# Patient Record
Sex: Male | Born: 1990 | Race: Black or African American | Hispanic: No | Marital: Single | Smoking: Former smoker
Health system: Southern US, Community
[De-identification: ages and names within clinical notes are randomized; demographics above are authoritative.]

---
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
## 2010-12-09 ENCOUNTER — Emergency Department: Payer: Self-pay | Admitting: Emergency Medicine

## 2010-12-22 ENCOUNTER — Emergency Department: Payer: Self-pay | Admitting: Unknown Physician Specialty

## 2011-04-27 ENCOUNTER — Emergency Department: Payer: Self-pay | Admitting: Unknown Physician Specialty

## 2011-06-15 ENCOUNTER — Emergency Department: Payer: Self-pay | Admitting: Emergency Medicine

## 2012-08-02 ENCOUNTER — Emergency Department: Payer: Self-pay | Admitting: Emergency Medicine

## 2012-11-12 ENCOUNTER — Emergency Department: Payer: Self-pay | Admitting: Emergency Medicine

## 2014-03-20 ENCOUNTER — Emergency Department: Payer: Self-pay | Admitting: Emergency Medicine

## 2014-03-23 LAB — BETA STREP CULTURE(ARMC)

## 2015-07-06 ENCOUNTER — Emergency Department
Admission: EM | Admit: 2015-07-06 | Discharge: 2015-07-06 | Disposition: A | Discharge status: Home / Self Care | Payer: Self-pay | Attending: Emergency Medicine | Admitting: Emergency Medicine

## 2015-07-06 ENCOUNTER — Encounter: Payer: Self-pay | Admitting: *Deleted

## 2015-07-06 DIAGNOSIS — Y9389 Activity, other specified: Secondary | ICD-10-CM | POA: Insufficient documentation

## 2015-07-06 DIAGNOSIS — S6991XA Unspecified injury of right wrist, hand and finger(s), initial encounter: Secondary | ICD-10-CM | POA: Insufficient documentation

## 2015-07-06 DIAGNOSIS — X31XXXA Exposure to excessive natural cold, initial encounter: Secondary | ICD-10-CM | POA: Insufficient documentation

## 2015-07-06 DIAGNOSIS — Y998 Other external cause status: Secondary | ICD-10-CM | POA: Insufficient documentation

## 2015-07-06 DIAGNOSIS — T699XXA Effect of reduced temperature, unspecified, initial encounter: Secondary | ICD-10-CM

## 2015-07-06 DIAGNOSIS — R202 Paresthesia of skin: Secondary | ICD-10-CM

## 2015-07-06 DIAGNOSIS — Y9289 Other specified places as the place of occurrence of the external cause: Secondary | ICD-10-CM | POA: Insufficient documentation

## 2015-07-06 DIAGNOSIS — S6992XA Unspecified injury of left wrist, hand and finger(s), initial encounter: Secondary | ICD-10-CM | POA: Insufficient documentation

## 2015-07-06 MED ORDER — METHYLPREDNISOLONE 4 MG PO TBPK: ORAL_TABLET | ORAL | Status: DC
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
## 2015-07-06 NOTE — Discharge Instructions (Signed)
Paresthesia  Paresthesia is a burning or prickling feeling. This feeling can happen in any part of the body. It often happens in the hands, arms, legs, or feet. Usually, it is not painful. In most cases, the feeling goes away in a short time and is not a sign of a serious problem.  HOME CARE  · Avoid drinking alcohol.  · Try massage or needle therapy (acupuncture) to help with your problems.  · Keep all follow-up visits as told by your doctor. This is important.  GET HELP IF:  · You keep on having episodes of paresthesia.  · Your burning or prickling feeling gets worse when you walk.  · You have pain or cramps.  · You feel dizzy.  · You have a rash.  GET HELP RIGHT AWAY IF:  · You feel weak.  · You have trouble walking or moving.  · You have problems speaking, understanding, or seeing.  · You feel confused.  · You cannot control when you pee (urinate) or poop (bowel movement).  · You lose feeling (numbness) after an injury.  · You pass out (faint).     This information is not intended to replace advice given to you by your health care provider. Make sure you discuss any questions you have with your health care provider.     Document Released: 05/26/2008 Document Revised: 10/28/2014 Document Reviewed: 06/09/2014  Elsevier Interactive Patient Education ©2016 Elsevier Inc.

## 2015-10-06 ENCOUNTER — Emergency Department
Admission: EM | Admit: 2015-10-06 | Discharge: 2015-10-06 | Disposition: A | Discharge status: Home / Self Care | Payer: Self-pay | Attending: Emergency Medicine | Admitting: Emergency Medicine

## 2015-10-06 ENCOUNTER — Encounter: Payer: Self-pay | Admitting: Emergency Medicine

## 2015-10-06 ENCOUNTER — Emergency Department: Payer: Self-pay

## 2015-10-06 DIAGNOSIS — R319 Hematuria, unspecified: Secondary | ICD-10-CM

## 2015-10-06 DIAGNOSIS — R309 Painful micturition, unspecified: Secondary | ICD-10-CM | POA: Insufficient documentation

## 2015-10-06 DIAGNOSIS — N50811 Right testicular pain: Secondary | ICD-10-CM

## 2015-10-06 DIAGNOSIS — R300 Dysuria: Secondary | ICD-10-CM

## 2015-10-06 LAB — URINALYSIS COMPLETE WITH MICROSCOPIC (ARMC ONLY)
BILIRUBIN URINE: NEGATIVE
Bacteria, UA: NONE SEEN
Glucose, UA: NEGATIVE mg/dL
Hgb urine dipstick: NEGATIVE
Leukocytes, UA: NEGATIVE
Nitrite: NEGATIVE
PROTEIN: 30 mg/dL — AB
SPECIFIC GRAVITY, URINE: 1.029 (ref 1.005–1.030)
Squamous Epithelial / LPF: NONE SEEN
pH: 5 (ref 5.0–8.0)

## 2015-10-06 LAB — CHLAMYDIA/NGC RT PCR (ARMC ONLY)
Chlamydia Tr: NOT DETECTED
N gonorrhoeae: NOT DETECTED

## 2015-10-06 NOTE — ED Provider Notes (Signed)
[facility] Emergency Department Provider Note  ____________________________________________  Time seen: Approximately 3:30 PM  I have reviewed the triage vital signs and the nursing notes.   HISTORY  Chief Complaint Dysuria and Hematuria    HPI Eddie York is a 25 y.o. male , otherwise healthy, presenting for hematuria, and right scrotal pain. The patient reports that this morning he noticed some mild hematuria, but did not have any burning sensation or pain with urination. No frequency. No purulent discharge from the penis or known lesions. Patient also reports that since 2016 he has intermittently had some pain in the right scrotum, mostly when he is sitting down at work, described as short small pains that resolved when he stands up.  SH: sexually active with a single male partner  History reviewed. No pertinent past medical history.  There are no active problems to display for this patient.   History reviewed. No pertinent past surgical history.  Current Outpatient Rx  Name  Route  Sig  Dispense  Refill  . methylPREDNISolone (MEDROL DOSEPAK) 4 MG TBPK tablet      Take Tapered dose as directed   21 tablet   0     Allergies Review of patient's allergies indicates no known allergies.  No family history on file.  Social History Social History  Substance Use Topics  . Smoking status: Never Smoker   . Smokeless tobacco: None  . Alcohol Use: Yes     Comment: couple times a week    Review of Systems Constitutional: No fever/chills. no lightheadedness or syncope.  Eyes: No visual changes. ENT: No sore throat. No congestion or rhinorrhea. Respiratory: Denies shortness of breath.   Gastrointestinal: No abdominal pain. no flank pain.   No nausea, no vomiting.  No diarrhea.  No constipation. Genitourinary: Negative for dysuria. positive hematuria. Positive intermittent right scrotal pain.  MusculoskeletPositive chronic and unchanged  patient moves around the room comfortably. ain. Skin: Negative for rash. Neurological: Negative for headaches. No focal numbness, tingling or weakness.   10-point ROS otherwise negative.  ____________________________________________   PHYSICAL EXAM:  VITAL SIGNS: ED Triage Vitals  Enc Vitals Group     BP 10/06/15 1502 155/93 mmHg     Pulse Rate 10/06/15 1502 92     Resp 10/06/15 1502 20     Temp 10/06/15 1502 98.7 F (37.1 C)     Temp Source 10/06/15 1502 Oral     SpO2 10/06/15 1502 96 %     Weight 10/06/15 1502 250 lb (113.399 kg)     Height 10/06/15 1502 6' (1.829 m)     Head Cir --      Peak Flow --      Pain Score 10/06/15 1513 0     Pain Loc --      Pain Edu? --      Excl. in GC? --     Constitutional: Alert and oriented. Well appearing and in no acute distress. Answers questions appropriately. moves about the room comfortably.  Eyes: Conjunctivae are normal.  EOMI. No scleral icterus. Head: Atraumatic. Nose: No congestion/rhinnorhea. Mouth/Throat: Mucous membranes are moist.  Neck: No stridor.  Supple.   Cardiovascular: Normal rate, regular rhythm. No murmurs, rubs or gallops.  Respiratory: Normal respiratory effort.  No accessory muscle use or retractions. Lungs CTAB.  No wheezes, rales or ronchi. Gastrointestinal: Soft, nontender and nondistended.  No guarding or rebound.  No peritoneal signs.  No CVA ttp. Genitourinary:  normal-appearing penis without lesions  or discharge. Normal lie of the testicles, without any palpable masses or pain on palpation. No inguinal hernias bilaterally. On the right side of the pubic symphysis, the patient has 2 discrete 3 mm erythematous lesions that are not vesicular and have no purulence that are most consistent with folliculitis. Musculoskeletal: No LE edema. No ttp in the calves or palpable cords.  Negative Homan's sign. Neurologic:  A&Ox3.  Speech is clear.  Face and smile are symmetric.  EOMI.  Moves all extremities well. Skin:   Skin is warm, dry and intact. Psychiatric: Mood and affect are normal. Speech and behavior are normal.  Normal judgement.  ____________________________________________   LABS (all labs ordered are listed, but only abnormal results are displayed)  Labs Reviewed  URINALYSIS COMPLETEWITH MICROSCOPIC ([facility] ONLY) - Abnormal; Notable for the following:    Color, Urine YELLOW (*)    APPearance CLEAR (*)    Ketones, ur TRACE (*)    Protein, ur 30 (*)    All other components within normal limits  CHLAMYDIA/NGC RT PCR ([facility] ONLY)   ____________________________________________  EKG  Not indicated ____________________________________________  RADIOLOGY  US Scrotum  10/06/2015  CLINICAL DATA:  Right testicular pain EXAM: SCROTAL ULTRASOUND DOPPLER ULTRASOUND OF THE TESTICLES TECHNIQUE: Complete ultrasound examination of the testicles, epididymis, and other scrotal structures was performed. Color and spectral Doppler ultrasound were also utilized to evaluate blood flow to the testicles. COMPARISON:  None. FINDINGS: Right testicle Measurements: 4.3 x 2.1 x 2.9 cm. No mass or microlithiasis visualized. Left testicle Measurements: 5.1 x 2 x 2.9 cm. No mass or microlithiasis visualized. Right epididymis:  Normal in size and appearance. Left epididymis: Small left epididymal cyst. Otherwise normal left epididymis. Hydrocele:  Small bilateral hydroceles. Varicocele:  None visualized. Pulsed Doppler interrogation of both testes demonstrates normal low resistance arterial and venous waveforms bilaterally. IMPRESSION: 1. No testicular torsion. 2. No testicular mass. Electronically Signed   By: Elige Ko   On: 10/06/2015 16:18   [place] Art/ven Flow Abd Pelv Doppler  10/06/2015  CLINICAL DATA:  Right testicular pain EXAM: SCROTAL ULTRASOUND DOPPLER ULTRASOUND OF THE TESTICLES TECHNIQUE: Complete ultrasound examination of the testicles, epididymis, and other scrotal structures was performed. Color and  spectral Doppler ultrasound were also utilized to evaluate blood flow to the testicles. COMPARISON:  None. FINDINGS: Right testicle Measurements: 4.3 x 2.1 x 2.9 cm. No mass or microlithiasis visualized. Left testicle Measurements: 5.1 x 2 x 2.9 cm. No mass or microlithiasis visualized. Right epididymis:  Normal in size and appearance. Left epididymis: Small left epididymal cyst. Otherwise normal left epididymis. Hydrocele:  Small bilateral hydroceles. Varicocele:  None visualized. Pulsed Doppler interrogation of both testes demonstrates normal low resistance arterial and venous waveforms bilaterally. IMPRESSION: 1. No testicular torsion. 2. No testicular mass. Electronically Signed   By: Elige Ko   On: 10/06/2015 16:18    ____________________________________________   PROCEDURES  Procedure(s) performed: None  Critical Care performed: No ____________________________________________   INITIAL IMPRESSION / ASSESSMENT AND PLAN / ED COURSE  Pertinent labs & imaging results that were available during my care of the patient were reviewed by me and considered in my medical decision making (see chart for details).  25 y.o. M presenting w/ hematuria x 1d and intermittent R scrotal discomfort since 2016 w/o any systemic sx's.  I will evaluate the pt for UTI, chlamydia and gonorrhea.  The most likely etiology of the pt's rash is folliculitis, rather than a herpetic outbreak as it does not have a  typical herpes appearance.  For precaution, however, I have told him to refrain from all sexual activity until fully healed.  The pt may have renal colic, but is not having any signs or symptoms of large stones or obstruction.  5:55 PM Pt does not have a UTI.  Pt u/s scrotum w/o torsion or mass.  Awaiting chlamydia/gonorrhea for discharge.  ____________________________________________  FINAL CLINICAL IMPRESSION(S) / ED DIAGNOSES  Final diagnoses:  Right testicular pain  Burning with urination   Hematuria      NEW MEDICATIONS STARTED DURING THIS VISIT:  Discharge Medication List as of 10/06/2015  5:13 PM       Rockne MenghiniAnne-Caroline Liisa Picone, MD 10/06/15 1755

## 2015-10-06 NOTE — ED Notes (Signed)
Patient presents to the ED with pain when using the bathroom and blood in his urine.  Patient reports scrotal pain as well.  Patient states symptoms began about 2 days ago.  Patient reports chronic lower back pain.  Patient is in no obvious distress in triage.  Sitting comfortably and laughing with significant other.

## 2015-10-06 NOTE — Discharge Instructions (Signed)
Please make an appointment with your primary care physician to be re-evaluated for your symptoms.  Return to the emergency department for severe pain, fever, inability to keep down liquids, or any other symptoms concerning to you.  Hematuria, Adult Hematuria is blood in your urine. It can be caused by a bladder infection, kidney infection, prostate infection, kidney stone, or cancer of your urinary tract. Infections can usually be treated with medicine, and a kidney stone usually will pass through your urine. If neither of these is the cause of your hematuria, further workup to find out the reason may be needed. It is very important that you tell your health care provider about any blood you see in your urine, even if the blood stops without treatment or happens without causing pain. Blood in your urine that happens and then stops and then happens again can be a symptom of a very serious condition. Also, pain is not a symptom in the initial stages of many urinary cancers. HOME CARE INSTRUCTIONS   Drink lots of fluid, 3-4 quarts a day. If you have been diagnosed with an infection, cranberry juice is especially recommended, in addition to large amounts of water.  Avoid caffeine, tea, and carbonated beverages because they tend to irritate the bladder.  Avoid alcohol because it may irritate the prostate.  Take all medicines as directed by your health care provider.  If you were prescribed an antibiotic medicine, finish it all even if you start to feel better.  If you have been diagnosed with a kidney stone, follow your health care provider's instructions regarding straining your urine to catch the stone.  Empty your bladder often. Avoid holding urine for long periods of time.  After a bowel movement, women should cleanse front to back. Use each tissue only once.  Empty your bladder before and after sexual intercourse if you are a male. SEEK MEDICAL CARE IF:  You develop back pain.  You  have a fever.  You have a feeling of sickness in your stomach (nausea) or vomiting.  Your symptoms are not better in 3 days. Return sooner if you are getting worse. SEEK IMMEDIATE MEDICAL CARE IF:   You develop severe vomiting and are unable to keep the medicine down.  You develop severe back or abdominal pain despite taking your medicines.  You begin passing a large amount of blood or clots in your urine.  You feel extremely weak or faint, or you pass out. MAKE SURE YOU:   Understand these instructions.  Will watch your condition.  Will get help right away if you are not doing well or get worse.   This information is not intended to replace advice given to you by your health care provider. Make sure you discuss any questions you have with your health care provider.   Document Released: 06/13/2005 Document Revised: 07/04/2014 Document Reviewed: 02/11/2013 Elsevier Interactive Patient Education [employer]2016 Elsevier Inc.

## 2016-04-14 ENCOUNTER — Emergency Department: Payer: Self-pay

## 2016-04-14 ENCOUNTER — Emergency Department
Admission: EM | Admit: 2016-04-14 | Discharge: 2016-04-14 | Disposition: A | Discharge status: Home / Self Care | Payer: Self-pay | Attending: Emergency Medicine | Admitting: Emergency Medicine

## 2016-04-14 ENCOUNTER — Encounter: Payer: Self-pay | Admitting: Emergency Medicine

## 2016-04-14 DIAGNOSIS — Z79899 Other long term (current) drug therapy: Secondary | ICD-10-CM | POA: Insufficient documentation

## 2016-04-14 DIAGNOSIS — M5440 Lumbago with sciatica, unspecified side: Secondary | ICD-10-CM | POA: Insufficient documentation

## 2016-04-14 MED ORDER — CYCLOBENZAPRINE HCL 10 MG PO TABS: 10.0000 mg | ORAL_TABLET | Freq: Three times a day (TID) | ORAL | 0 refills | Status: DC | PRN

## 2016-04-14 MED ORDER — METHYLPREDNISOLONE 4 MG PO TBPK: ORAL_TABLET | ORAL | 0 refills | Status: DC

## 2016-04-14 MED ORDER — TRAMADOL HCL 50 MG PO TABS: 50.0000 mg | ORAL_TABLET | Freq: Four times a day (QID) | ORAL | 0 refills | Status: DC | PRN

## 2016-04-14 NOTE — ED Notes (Signed)
Patient transported to X-ray 

## 2016-04-14 NOTE — ED Provider Notes (Signed)
[facility] Emergency Department Provider Note   ____________________________________________   None    (approximate)  I have reviewed the triage vital signs and the nursing notes.   HISTORY  Chief Complaint Back Pain    HPI Eddie York is a 25 y.o. male is complaining of chronic back pain for almost 10 years. Patient denies any specific injury. Patient stated pain increases when he works longer than 6 hours today. Patient's stay in the last 2 days his pain has increased with radicular component to the bilateral legs. Patient denies any bladder or bowel dysfunction. Patient state the discomfort is relieved by rest. Patient is currently rating his pain as a 10 over 10. No palliative measures taken for this complaint.   History reviewed. No pertinent past medical history.  There are no active problems to display for this patient.   History reviewed. No pertinent surgical history.  Prior to Admission medications   Medication Sig Start Date End Date Taking? Authorizing Provider  cyclobenzaprine (FLEXERIL) 10 MG tablet Take 1 tablet (10 mg total) by mouth 3 (three) times daily as needed. 04/14/16   Joni Reiningonald K Smith, PA-C  methylPREDNISolone (MEDROL DOSEPAK) 4 MG TBPK tablet Take Tapered dose as directed 07/06/15   Joni Reiningonald K Smith, PA-C  methylPREDNISolone (MEDROL DOSEPAK) 4 MG TBPK tablet Take Tapered dose as directed 04/14/16   Joni Reiningonald K Smith, PA-C  traMADol (ULTRAM) 50 MG tablet Take 1 tablet (50 mg total) by mouth every 6 (six) hours as needed for moderate pain. 04/14/16   Joni Reiningonald K Smith, PA-C    Allergies Review of patient's allergies indicates no known allergies.  History reviewed. No pertinent family history.  Social History Social History  Substance Use Topics  . Smoking status: Never Smoker  . Smokeless tobacco: Never Used  . Alcohol use Yes     Comment: occassional    Review of Systems Constitutional: No fever/chills Eyes: No visual  changes. ENT: No sore throat. Cardiovascular: Denies chest pain. Respiratory: Denies shortness of breath. Gastrointestinal: No abdominal pain.  No nausea, no vomiting.  No diarrhea.  No constipation. Genitourinary: Negative for dysuria. Musculoskeletal: Positive for back pain. Skin: Negative for rash. Neurological: Negative for headaches, focal weakness or numbness.    ____________________________________________   PHYSICAL EXAM:  VITAL SIGNS: ED Triage Vitals  Enc Vitals Group     BP 04/14/16 0738 (!) 152/100     Pulse Rate 04/14/16 0738 70     Resp 04/14/16 0738 16     Temp 04/14/16 0738 98 F (36.7 C)     Temp Source 04/14/16 0738 Oral     SpO2 04/14/16 0738 99 %     Weight 04/14/16 0739 280 lb (127 kg)     Height 04/14/16 0739 6' (1.829 m)     Head Circumference --      Peak Flow --      Pain Score 04/14/16 0739 10     Pain Loc --      Pain Edu? --      Excl. in GC? --     Constitutional: Alert and oriented. Well appearing and in no acute distress. Eyes: Conjunctivae are normal. PERRL. EOMI. Head: Atraumatic. Nose: No congestion/rhinnorhea. Mouth/Throat: Mucous membranes are moist.  Oropharynx non-erythematous. Neck: No stridor.  No cervical spine tenderness to palpation. Hematological/Lymphatic/Immunilogical: No cervical lymphadenopathy. Cardiovascular: Normal rate, regular rhythm. Grossly normal heart sounds.  Good peripheral circulation. Respiratory: Normal respiratory effort.  No retractions. Lungs CTAB. Gastrointestinal: Soft and  nontender. No distention. No abdominal bruits. No CVA tenderness. Musculoskeletal: Normal spinal deformity. There is no guarding with palpation spinal process. Patient has bilateral spinal muscle spasm with lateral movements. Patient is a negative straight leg test. Patient has a normal gait.  Neurologic:  Normal speech and language. No gross focal neurologic deficits are appreciated. No gait instability. Skin:  Skin is warm, dry and  intact. No rash noted. Psychiatric: Mood and affect are normal. Speech and behavior are normal.  ____________________________________________   LABS (all labs ordered are listed, but only abnormal results are displayed)  Labs Reviewed - No data to display ____________________________________________  EKG   ____________________________________________  RADIOLOGY  No acute findings x-ray of the lumbar spine. ____________________________________________   PROCEDURES  Procedure(s) performed: None  Procedures  Critical Care performed: No  ____________________________________________   INITIAL IMPRESSION / ASSESSMENT AND PLAN / ED COURSE  Pertinent labs & imaging results that were available during my care of the patient were reviewed by me and considered in my medical decision making (see chart for details).  Low back pain with radicular component. Discussed negative x-ray finding with patient. Patient given discharge Instructions. Patient given a prescription for Flexeril and the Medrol Dosepak. Patient advised follow "clinic condition persists.  Clinical Course     ____________________________________________   FINAL CLINICAL IMPRESSION(S) / ED DIAGNOSES  Final diagnoses:  Acute bilateral low back pain with sciatica, sciatica laterality unspecified      NEW MEDICATIONS STARTED DURING THIS VISIT:  New Prescriptions   CYCLOBENZAPRINE (FLEXERIL) 10 MG TABLET    Take 1 tablet (10 mg total) by mouth 3 (three) times daily as needed.   METHYLPREDNISOLONE (MEDROL DOSEPAK) 4 MG TBPK TABLET    Take Tapered dose as directed   TRAMADOL (ULTRAM) 50 MG TABLET    Take 1 tablet (50 mg total) by mouth every 6 (six) hours as needed for moderate pain.     Note:  This document was prepared using Dragon voice recognition software and may include unintentional dictation errors.    Joni Reining, PA-C 04/14/16 1610    Jene Every, MD 04/14/16 1027

## 2016-04-14 NOTE — ED Triage Notes (Signed)
Pt reports chronic back pain since he was 17. Pt reports no injury. Pt states that if he works for a long time, a few hours later his back starts hurting and it is now more constant and getting worse. Relieved by rest.

## 2016-08-10 ENCOUNTER — Emergency Department
Admission: EM | Admit: 2016-08-10 | Discharge: 2016-08-10 | Disposition: A | Discharge status: Home / Self Care | Payer: Self-pay | Attending: Emergency Medicine | Admitting: Emergency Medicine

## 2016-08-10 ENCOUNTER — Encounter: Payer: Self-pay | Admitting: Medical Oncology

## 2016-08-10 DIAGNOSIS — R5383 Other fatigue: Secondary | ICD-10-CM | POA: Insufficient documentation

## 2016-08-10 DIAGNOSIS — R0981 Nasal congestion: Secondary | ICD-10-CM | POA: Insufficient documentation

## 2016-08-10 DIAGNOSIS — R69 Illness, unspecified: Secondary | ICD-10-CM

## 2016-08-10 DIAGNOSIS — R51 Headache: Secondary | ICD-10-CM | POA: Insufficient documentation

## 2016-08-10 DIAGNOSIS — J111 Influenza due to unidentified influenza virus with other respiratory manifestations: Secondary | ICD-10-CM

## 2016-08-10 DIAGNOSIS — R05 Cough: Secondary | ICD-10-CM | POA: Insufficient documentation

## 2016-08-10 NOTE — ED Notes (Signed)
See triage note  States he developed cough  Nasal congestion and body aches last Tuesday   Afebrile on arrival

## 2016-08-10 NOTE — ED Triage Notes (Signed)
Pt reports flu like sx's -that began last Tuesday.

## 2016-08-10 NOTE — ED Provider Notes (Signed)
[facility] Emergency Department Provider Note  ____________________________________________  Time seen: Approximately 9:22 AM  I have reviewed the triage vital signs and the nursing notes.   HISTORY  Chief Complaint Generalized Body Aches; Chills; and Cough    HPI Eddie York is a 26 y.o. male , NAD, presents to the emergency department for 1 week history of flulike symptoms. Patient states he began to feel ill last Tuesday. Had onset of nasal congestion, runny nose, sore throat, headache, cough and chest congestion along with fevers, chills or body aches. States that over the last week his symptoms have begun to improve. Continues with nasal congestion and runny nose with some fatigue and cough. He has been taking over-the-counter medications which seem to be helping. States he has been working throughout the entire illness but was concerned he may have had the flu during this time. Denies any chest pain, shortness breath, wheezing, abdominal pain, nausea, vomiting or diarrhea. Fevers have resolved.   History reviewed. No pertinent past medical history.  There are no active problems to display for this patient.   History reviewed. No pertinent surgical history.  Prior to Admission medications   Not on File    Allergies Patient has no known allergies.  No family history on file.  Social History Social History  Substance Use Topics  . Smoking status: Never Smoker  . Smokeless tobacco: Never Used  . Alcohol use Yes     Comment: occassional     Review of Systems  Constitutional: Positive fatigue. No fever/chills ENT: Positive nasal congestion, runny nose. No sore throat, ear pain, ear drainage. Cardiovascular: No chest pain. Respiratory: Positive cough, chest congestion. No shortness of breath. No wheezing.  Gastrointestinal: No abdominal pain.  No nausea, vomiting.  No diarrhea.  Musculoskeletal: Negative for general myalgias.  Skin:  Negative for rash. Neurological: Positive for headaches, but no focal weakness or numbness. 10-point ROS otherwise negative.  ____________________________________________   PHYSICAL EXAM:  VITAL SIGNS: ED Triage Vitals [08/10/16 0757]  Enc Vitals Group     BP (!) 154/80     Pulse Rate 63     Resp 16     Temp 97.6 F (36.4 C)     Temp Source Oral     SpO2 95 %     Weight 250 lb (113.4 kg)     Height 6\' 1"  (1.854 m)     Head Circumference      Peak Flow      Pain Score 6     Pain Loc      Pain Edu?      Excl. in GC?      Constitutional: Alert and oriented. Well appearing and in no acute distress. Eyes: Conjunctivae are normal.  Head: Atraumatic. ENT:      Ears: TMs visualized bilaterally without erythema, effusion, bulging or perforation.      Nose: Moderate congestion with clear rhinorrhea. Bilateral turbinates are injected with mild edema.      Mouth/Throat: Mucous membranes are moist. Pharynx without erythema, swelling, exudate. Uvula is midline. Airway is patent. Clear postnasal drainage. Neck: No stridor. Supple with full range of motion. Hematological/Lymphatic/Immunilogical: No cervical lymphadenopathy. Cardiovascular: Normal rate, regular rhythm. Normal S1 and S2.  Good peripheral circulation. Respiratory: Normal respiratory effort without tachypnea or retractions. Lungs CTAB with breath sounds noted in all lung fields. No wheeze, rhonchi, rales Neurologic:  Normal speech and language. No gross focal neurologic deficits are appreciated.  Skin:  Skin is  warm, dry and intact. No rash noted. Psychiatric: Mood and affect are normal. Speech and behavior are normal. Patient exhibits appropriate insight and judgement.   ____________________________________________    LABS  None ____________________________________________  EKG  None ____________________________________________  RADIOLOGY  None ____________________________________________    PROCEDURES  Procedure(s) performed: None   Procedures   Medications - No data to display   ____________________________________________   INITIAL IMPRESSION / ASSESSMENT AND PLAN / ED COURSE  Pertinent labs & imaging results that were available during my care of the patient were reviewed by me and considered in my medical decision making (see chart for details).     Patient's diagnosis is consistent with influenza-like illness. Discussion had with the patient in regards to his illness and that he very well likely did have influenza. Considering the illness has been present for almost 7 days, testing and treatment with Tamiflu would not be efficacious. Patient was given a work note to excuse from work today and tomorrow to allow rest and recovery. Patient will be discharged home with instructions to continue over-the-counter medications as needed. Patient declined any prescription medications for his current symptoms. Patient is to follow up with [facility] community [facility] if symptoms persist past this treatment course. Patient is given ED precautions to return to the ED for any worsening or new symptoms.   ____________________________________________  FINAL CLINICAL IMPRESSION(S) / ED DIAGNOSES  Final diagnoses:  Influenza-like illness      NEW MEDICATIONS STARTED DURING THIS VISIT:  Discharge Medication List as of 08/10/2016  9:59 AM           Hope Pigeon, PA-C 08/10/16 1535    Arnaldo Natal, MD 08/10/16 [phone]

## 2016-09-29 ENCOUNTER — Encounter: Payer: Self-pay | Admitting: Emergency Medicine

## 2016-09-29 ENCOUNTER — Emergency Department
Admission: EM | Admit: 2016-09-29 | Discharge: 2016-09-29 | Disposition: A | Discharge status: Home / Self Care | Payer: Self-pay | Attending: Emergency Medicine | Admitting: Emergency Medicine

## 2016-09-29 DIAGNOSIS — Z202 Contact with and (suspected) exposure to infections with a predominantly sexual mode of transmission: Secondary | ICD-10-CM | POA: Insufficient documentation

## 2016-09-29 DIAGNOSIS — Z711 Person with feared health complaint in whom no diagnosis is made: Secondary | ICD-10-CM

## 2016-09-29 LAB — URINALYSIS, COMPLETE (UACMP) WITH MICROSCOPIC
Bacteria, UA: NONE SEEN
Bilirubin Urine: NEGATIVE
GLUCOSE, UA: NEGATIVE mg/dL
Hgb urine dipstick: NEGATIVE
Ketones, ur: 5 mg/dL — AB
LEUKOCYTES UA: NEGATIVE
Nitrite: NEGATIVE
PH: 6 (ref 5.0–8.0)
PROTEIN: 30 mg/dL — AB
SPECIFIC GRAVITY, URINE: 1.032 — AB (ref 1.005–1.030)

## 2016-09-29 LAB — CHLAMYDIA/NGC RT PCR (ARMC ONLY)
CHLAMYDIA TR: NOT DETECTED
N gonorrhoeae: NOT DETECTED

## 2016-09-29 NOTE — ED Provider Notes (Signed)
[facility] Emergency Department Provider Note  ____________________________________________  Time seen: Approximately 9:49 PM  I have reviewed the triage vital signs and the nursing notes.   HISTORY  Chief Complaint Exposure to STD    HPI Eddie York is a 26 y.o. male who presents to emergency department for possible exposure to chlamydia. Patient states that he had unprotected sex with partner who informed him that she tested positive for chlamydia. Patient denies any discharge, dysuria, polyuria, genital lesions. Patient is presenting to the emergency department requesting testing. No other complaints at this time. No other symptoms.   History reviewed. No pertinent past medical history.  There are no active problems to display for this patient.   History reviewed. No pertinent surgical history.  Prior to Admission medications   Not on File    Allergies Patient has no known allergies.  No family history on file.  Social History Social History  Substance Use Topics  . Smoking status: Never Smoker  . Smokeless tobacco: Never Used  . Alcohol use Yes     Comment: occassional     Review of Systems  Constitutional: No fever/chills Eyes: No visual changes. No discharge Cardiovascular: no chest pain. Respiratory: no cough. No SOB. Gastrointestinal: No abdominal pain.  No nausea, no vomiting.  No diarrhea.  No constipation. Genitourinary: Negative for dysuria. No hematuria. No penile discharge Musculoskeletal: Negative for musculoskeletal pain. Skin: Negative for rash, abrasions, lacerations, ecchymosis. Neurological: Negative for headaches, focal weakness or numbness. 10-point ROS otherwise negative.  ____________________________________________   PHYSICAL EXAM:  VITAL SIGNS: ED Triage Vitals  Enc Vitals Group     BP 09/29/16 2050 (!) 164/85     Pulse Rate 09/29/16 2050 100     Resp 09/29/16 2050 20     Temp 09/29/16 2050  97.8 F (36.6 C)     Temp Source 09/29/16 2050 Oral     SpO2 09/29/16 2050 99 %     Weight 09/29/16 2049 250 lb (113.4 kg)     Height 09/29/16 2049 6' (1.829 m)     Head Circumference --      Peak Flow --      Pain Score --      Pain Loc --      Pain Edu? --      Excl. in GC? --      Constitutional: Alert and oriented. Well appearing and in no acute distress. Eyes: Conjunctivae are normal. PERRL. EOMI. Head: Atraumatic. Neck: No stridor.    Cardiovascular: Normal rate, regular rhythm. Normal S1 and S2.  Good peripheral circulation. Respiratory: Normal respiratory effort without tachypnea or retractions. Lungs CTAB. Good air entry to the bases with no decreased or absent breath sounds. Gastrointestinal: Bowel sounds 4 quadrants. Soft and nontender to palpation. No guarding or rigidity. No palpable masses. No distention. No CVA tenderness. Genitourinary: No chancres, lesions. No discharge noted. Musculoskeletal: Full range of motion to all extremities. No gross deformities appreciated. Neurologic:  Normal speech and language. No gross focal neurologic deficits are appreciated.  Skin:  Skin is warm, dry and intact. No rash noted. Psychiatric: Mood and affect are normal. Speech and behavior are normal. Patient exhibits appropriate insight and judgement.   ____________________________________________   LABS (all labs ordered are listed, but only abnormal results are displayed)  Labs Reviewed  URINALYSIS, COMPLETE (UACMP) WITH MICROSCOPIC - Abnormal; Notable for the following:       Result Value   Color, Urine YELLOW (*)  APPearance CLEAR (*)    Specific Gravity, Urine 1.032 (*)    Ketones, ur 5 (*)    Protein, ur 30 (*)    Squamous Epithelial / LPF 0-5 (*)    All other components within normal limits  CHLAMYDIA/NGC RT PCR ([facility] ONLY)   ____________________________________________  EKG   ____________________________________________  RADIOLOGY   No  results found.  ____________________________________________    PROCEDURES  Procedure(s) performed:    Procedures    Medications - No data to display   ____________________________________________   INITIAL IMPRESSION / ASSESSMENT AND PLAN / ED COURSE  Pertinent labs & imaging results that were available during my care of the patient were reviewed by me and considered in my medical decision making (see chart for details).  Review of the [place] CSRS was performed in accordance of the [facility] prior to dispensing any controlled drugs.     Patient's diagnosis is consistent with encounter for STD testing without diagnosis. Patient has been exposed to chlamydia but is symptom-free. Urinalysis and gonorrhea and chlamydia testing returned with no acute findings. No treatment is necessary at this time. Should patient develop symptoms, he'll follow-up with health department for further STD testing..  Patient is given ED precautions to return to the ED for any worsening or new symptoms.     ____________________________________________  FINAL CLINICAL IMPRESSION(S) / ED DIAGNOSES  Final diagnoses:  Exposure to STD  Concern about STD in male without diagnosis      NEW MEDICATIONS STARTED DURING THIS VISIT:  There are no discharge medications for this patient.       This chart was dictated using voice recognition software/Dragon. Despite best efforts to proofread, errors can occur which can change the meaning. Any change was purely unintentional.    Racheal Patches, PA-C 09/29/16 2244    Rockne Menghini, MD 09/30/16 1610

## 2016-09-29 NOTE — ED Triage Notes (Signed)
Patient ambulatory to triage with steady gait, without difficulty or distress noted; pt reports that he was told that he needed to be tested for gonorrhea & chlamydia but denies any c/o

## 2016-12-29 ENCOUNTER — Encounter: Payer: Self-pay | Admitting: Emergency Medicine

## 2016-12-29 ENCOUNTER — Emergency Department
Admission: EM | Admit: 2016-12-29 | Discharge: 2016-12-29 | Disposition: A | Discharge status: Home / Self Care | Payer: Self-pay | Attending: Emergency Medicine | Admitting: Emergency Medicine

## 2016-12-29 ENCOUNTER — Emergency Department: Payer: Self-pay

## 2016-12-29 DIAGNOSIS — Y999 Unspecified external cause status: Secondary | ICD-10-CM | POA: Insufficient documentation

## 2016-12-29 DIAGNOSIS — S86911A Strain of unspecified muscle(s) and tendon(s) at lower leg level, right leg, initial encounter: Secondary | ICD-10-CM | POA: Insufficient documentation

## 2016-12-29 DIAGNOSIS — X509XXA Other and unspecified overexertion or strenuous movements or postures, initial encounter: Secondary | ICD-10-CM | POA: Insufficient documentation

## 2016-12-29 DIAGNOSIS — M7051 Other bursitis of knee, right knee: Secondary | ICD-10-CM | POA: Insufficient documentation

## 2016-12-29 DIAGNOSIS — Y929 Unspecified place or not applicable: Secondary | ICD-10-CM | POA: Insufficient documentation

## 2016-12-29 DIAGNOSIS — Y9302 Activity, running: Secondary | ICD-10-CM | POA: Insufficient documentation

## 2016-12-29 MED ORDER — CYCLOBENZAPRINE HCL 5 MG PO TABS: 5.0000 mg | ORAL_TABLET | Freq: Three times a day (TID) | ORAL | 0 refills | Status: DC | PRN

## 2016-12-29 MED ORDER — NAPROXEN 500 MG PO TABS: 500.0000 mg | ORAL_TABLET | Freq: Two times a day (BID) | ORAL | 0 refills | Status: AC

## 2016-12-29 NOTE — Discharge Instructions (Signed)
Your exam and x-ray do not reveal any fracture or dislocation to the knee. Wear the knee sleeve as needed for walking. Apply ice to reduce pain and swelling. Follow-up with Dr. Ernest PineHooten for continued symptoms. Return to the ED as needed.

## 2016-12-29 NOTE — ED Triage Notes (Signed)
Pt c/o right leg pain after injury when running yesterday.  Did not fall. Pain mostly to knee and ankle. NAd. VSS

## 2016-12-29 NOTE — ED Provider Notes (Signed)
[facility] Emergency Department Provider Note ____________________________________________  Time seen: 1041  I have reviewed the triage vital signs and the nursing notes.  HISTORY  Chief Complaint  Leg Injury  HPI Eddie York is a 26 y.o. male presents to the ED for evaluation of sudden, acute pain to the lateral right knee after running yesterday. Patient describes he was running sprints against one of his family members, and as he was slowing down he felt immediate pull to the lateral aspect of the knee. He denies falling, tripping, or roll and ankle. He does note that since that time he's had difficulty bearing weight through that knee and has noted pain to the lateral aspect of the knee and calf. He denies any shortness of breath, chest pain, or other injury. He also denies any distal paresthesias, foot drop, or swelling.Denies any history of chronic, intermittent, ongoing knee pain.  History reviewed. No pertinent past medical history.  There are no active problems to display for this patient.   History reviewed. No pertinent surgical history.  Prior to Admission medications   Medication Sig Start Date End Date Taking? Authorizing Provider  cyclobenzaprine (FLEXERIL) 5 MG tablet Take 1 tablet (5 mg total) by mouth 3 (three) times daily as needed for muscle spasms. 12/29/16   Tami Barren, Charlesetta IvoryJenise V Bacon, PA-C  naproxen (NAPROSYN) 500 MG tablet Take 1 tablet (500 mg total) by mouth 2 (two) times daily with a meal. 12/29/16 01/28/17  Jeslynn Hollander, Charlesetta IvoryJenise V Bacon, PA-C    Allergies Patient has no known allergies.  History reviewed. No pertinent family history.  Social History Social History  Substance Use Topics  . Smoking status: Never Smoker  . Smokeless tobacco: Never Used  . Alcohol use Yes     Comment: occassional    Review of Systems  Constitutional: Negative for fever. Cardiovascular: Negative for chest pain. Respiratory: Negative for shortness of  breath. Musculoskeletal: Negative for back pain. Right leg pain as above.  Skin: Negative for rash. Neurological: Negative for headaches, focal weakness or numbness. ____________________________________________  PHYSICAL EXAM:  VITAL SIGNS: ED Triage Vitals  Enc Vitals Group     BP 12/29/16 1018 (!) 155/89     Pulse Rate 12/29/16 1018 79     Resp 12/29/16 1018 20     Temp 12/29/16 1018 98.7 F (37.1 C)     Temp Source 12/29/16 1018 Oral     SpO2 12/29/16 1018 97 %     Weight 12/29/16 1018 250 lb (113.4 kg)     Height 12/29/16 1018 6' (1.829 m)     Head Circumference --      Peak Flow --      Pain Score 12/29/16 1017 8     Pain Loc --      Pain Edu? --      Excl. in GC? --     Constitutional: Alert and oriented. Well appearing and in no distress. Head: Normocephalic and atraumatic. Cardiovascular: Normal rate, regular rhythm. Normal distal pulses. Respiratory: Normal respiratory effort.  Musculoskeletal: Right knee without any obvious deformity, dislocation, or effusion. Patient was normal at knee exam without signs of child arrangement. No popliteal space was noted. No patella ballottement is appreciated. Negative anterior posterior drawer. Patient is tender to palpation over the fibular head at the lateral knee. No specific valgus or varus joint stress is noted. Nontender with normal range of motion in all extremities.  Neurologic:  Antalgic gait without ataxia. Normal speech and language. No  gross focal neurologic deficits are appreciated. ____________________________________________   RADIOLOGY  Right Knee  IMPRESSION: No acute abnormality. Question of slightly diminished medial compartment joint space.  I, Viha Kriegel, Charlesetta Ivory, personally viewed and evaluated these images (plain radiographs) as part of my medical decision making, as well as reviewing the written report by the radiologist. ____________________________________________  PROCEDURES  Ace  bandage Knee immobilizer crutches ____________________________________________  INITIAL IMPRESSION / ASSESSMENT AND PLAN / ED COURSE  Patient was ED evaluation of acute lateral right knee pain following mechanical injury. He likely has sustained a fibular ligament strain or bursitis. He'll be discharged with prescriptions for naproxen and Flexeril. He is also placed in an Ace bandage and knee immobilizer with crutches for assisted gait. He will follow-up with Dr. Ernest Pine for ongoing symptom management. He reports that is provided for 2 days as requested. ____________________________________________  FINAL CLINICAL IMPRESSION(S) / ED DIAGNOSES  Final diagnoses:  Knee strain, right, initial encounter  Fibular collateral ligament bursitis, right      Lissa Hoard, PA-C 12/29/16 1848    Myrna Blazer, MD 01/03/17 [phone]

## 2018-07-03 ENCOUNTER — Other Ambulatory Visit: Payer: Self-pay

## 2018-07-03 ENCOUNTER — Emergency Department
Admission: EM | Admit: 2018-07-03 | Discharge: 2018-07-03 | Disposition: A | Discharge status: Home / Self Care | Payer: 59 | Attending: Emergency Medicine | Admitting: Emergency Medicine

## 2018-07-03 ENCOUNTER — Encounter: Payer: Self-pay | Admitting: Emergency Medicine

## 2018-07-03 ENCOUNTER — Emergency Department: Payer: 59

## 2018-07-03 DIAGNOSIS — Y999 Unspecified external cause status: Secondary | ICD-10-CM | POA: Diagnosis not present

## 2018-07-03 DIAGNOSIS — Y9239 Other specified sports and athletic area as the place of occurrence of the external cause: Secondary | ICD-10-CM | POA: Insufficient documentation

## 2018-07-03 DIAGNOSIS — Y9354 Activity, bowling: Secondary | ICD-10-CM | POA: Insufficient documentation

## 2018-07-03 DIAGNOSIS — W230XXA Caught, crushed, jammed, or pinched between moving objects, initial encounter: Secondary | ICD-10-CM | POA: Insufficient documentation

## 2018-07-03 DIAGNOSIS — S63619A Unspecified sprain of unspecified finger, initial encounter: Secondary | ICD-10-CM

## 2018-07-03 DIAGNOSIS — S63601A Unspecified sprain of right thumb, initial encounter: Secondary | ICD-10-CM

## 2018-07-03 MED ORDER — IBUPROFEN 800 MG PO TABS
800.0000 mg | ORAL_TABLET | Freq: Once | ORAL | Status: AC
  Administered 2018-07-03: 800 mg via ORAL
  Filled 2018-07-03: qty 1

## 2018-07-03 NOTE — ED Provider Notes (Signed)
[facility] Emergency Department Provider Note ____________________________________________  Time seen: 2001  I have reviewed the triage vital signs and the nursing notes.  HISTORY  Chief Complaint  Hand Injury  HPI Eddie York is a 28 y.o.right-handed male who presents himself to the ED for evaluation of right hand pain and disability.  Patient describes injury occurred on Sunday while he was bowling.  He describes his fingers getting stuck in the bowling ball, causing a hyper extension injury to the middle 2 fingers and the thumb.  He describes since that time he said pain and swelling across the dorsal knuckles as well as discomfort with performing a composite fist.  He denies any outright injury by fall or contusion.  He is also had some pain to the volar and dorsal forearm with wrist range of motion.  He has been taking Tylenol intermittently with limited benefit.  He presents now for further evaluation of his injuries.  History reviewed. No pertinent past medical history.  There are no active problems to display for this patient.  History reviewed. No pertinent surgical history.  Prior to Admission medications   Medication Sig Start Date End Date Taking? Authorizing Provider  cyclobenzaprine (FLEXERIL) 5 MG tablet Take 1 tablet (5 mg total) by mouth 3 (three) times daily as needed for muscle spasms. 12/29/16   Aryon Nham, Charlesetta Ivory, PA-C   Allergies Patient has no known allergies.  No family history on file.  Social History Social History   Tobacco Use  . Smoking status: Never Smoker  . Smokeless tobacco: Never Used  Substance Use Topics  . Alcohol use: Yes    Comment: occassional  . Drug use: No    Review of Systems  Constitutional: Negative for fever. Cardiovascular: Negative for chest pain. Respiratory: Negative for shortness of breath. Musculoskeletal: Negative for back pain. Right hand pain as above Skin: Negative for  rash. Neurological: Negative for headaches, focal weakness or numbness. ____________________________________________  PHYSICAL EXAM:  VITAL SIGNS: ED Triage Vitals  Enc Vitals Group     BP 07/03/18 1733 127/68     Pulse Rate 07/03/18 1733 76     Resp 07/03/18 1733 16     Temp 07/03/18 1733 99.3 F (37.4 C)     Temp Source 07/03/18 1733 Oral     SpO2 07/03/18 1733 97 %     Weight 07/03/18 1733 157 lb 3.2 oz (71.3 kg)     Height 07/03/18 1733 5\' 2"  (1.575 m)     Head Circumference --      Peak Flow --      Pain Score 07/03/18 1739 7     Pain Loc --      Pain Edu? --      Excl. in GC? --     Constitutional: Alert and oriented. Well appearing and in no distress. Head: Normocephalic and atraumatic. Eyes: Conjunctivae are normal. Normal extraocular movements Cardiovascular: Normal rate, regular rhythm. Normal distal pulses. Respiratory: Normal respiratory effort.  Musculoskeletal: Right hand without obvious deformity or dislocation.  Patient with some subtle soft tissue swelling over the second and third MCPs dorsally.  Patient is able to demonstrate a normal composite fist although flexion range is slow.  He describes pain with resistance testing to the fingers.  Nontender with normal range of motion in all extremities.  Neurologic:  Normal gross sensation.  Normal intrinsic and opposition testing.  Negative Finkelstein. Normal speech and language. No gross focal neurologic deficits are appreciated. Skin:  Skin is warm, dry and intact. No rash noted.  No clubbing, cyanosis, or edema is appreciated. ____________________________________________   RADIOLOGY  Right Hand  Negative ____________________________________________  PROCEDURES  Procedures Fingers buddy taped IBU 800 mg PO ____________________________________________  INITIAL IMPRESSION / ASSESSMENT AND PLAN / ED COURSE  Patient with ED evaluation of pain and disability to the middle fingers primarily as well as the  right thumb of the right hand.  Patient injury occurred while holding.  He sustained an hyper extension injury to the middle fingers.  He also has some signs of some volar forearm muscle strain.  Patient's x-ray is reassuring as it shows no acute fracture or dislocation.  Patient's fingers are buddy taped for comfort and support, and he is given rice management instructions.  He will follow with primary provider or see orthopedics for ongoing symptom management. ____________________________________________  FINAL CLINICAL IMPRESSION(S) / ED DIAGNOSES  Final diagnoses:  Sprain of multiple digits of right hand including sprain of thumb, initial encounter      Lissa Hoard, PA-C 07/03/18 2122    Arnaldo Natal, MD 07/04/18 [phone]

## 2018-07-03 NOTE — ED Notes (Signed)
Reviewed discharge instructions, follow-up care, and OTC pain relievers with patient. Patient verbalized understanding of all information reviewed. Patient stable, with no distress noted at this time.    

## 2018-07-03 NOTE — ED Notes (Signed)
Right 3rd and 4th finger buddy taped.  meds given.

## 2018-07-03 NOTE — Discharge Instructions (Addendum)
Your x-rays are negative for fracture or dislocation to the fingers. Wear the fingers buddy-taped for support. Apply ice to reduce swelling. Take OTC ibuprofen for pain and inflammation. Follow-up with Dr. Joice Lofts as needed.

## 2018-07-03 NOTE — ED Notes (Signed)
See triage note developed pain to right hand  States he went bowling on Sunday and then pain started  No swelling noted

## 2018-07-03 NOTE — ED Triage Notes (Addendum)
PT c/o RT hand pain and decreased sensation/ ROM after bowling injury on Sunday. NAD, no deformity noted.

## 2018-08-14 ENCOUNTER — Emergency Department: Payer: 59

## 2018-08-14 ENCOUNTER — Other Ambulatory Visit: Payer: Self-pay

## 2018-08-14 ENCOUNTER — Emergency Department
Admission: EM | Admit: 2018-08-14 | Discharge: 2018-08-15 | Disposition: A | Discharge status: Home / Self Care | Payer: 59 | Attending: Emergency Medicine | Admitting: Emergency Medicine

## 2018-08-14 ENCOUNTER — Encounter: Payer: Self-pay | Admitting: Emergency Medicine

## 2018-08-14 DIAGNOSIS — M7918 Myalgia, other site: Secondary | ICD-10-CM | POA: Diagnosis not present

## 2018-08-14 DIAGNOSIS — R1084 Generalized abdominal pain: Secondary | ICD-10-CM | POA: Insufficient documentation

## 2018-08-14 DIAGNOSIS — M545 Low back pain: Secondary | ICD-10-CM | POA: Diagnosis present

## 2018-08-14 LAB — COMPREHENSIVE METABOLIC PANEL
ALK PHOS: 59 U/L (ref 38–126)
ALT: 22 U/L (ref 0–44)
AST: 23 U/L (ref 15–41)
Albumin: 4.1 g/dL (ref 3.5–5.0)
Anion gap: 9 (ref 5–15)
BUN: 16 mg/dL (ref 6–20)
CALCIUM: 8.7 mg/dL — AB (ref 8.9–10.3)
CO2: 25 mmol/L (ref 22–32)
CREATININE: 1.18 mg/dL (ref 0.61–1.24)
Chloride: 107 mmol/L (ref 98–111)
Glucose, Bld: 134 mg/dL — ABNORMAL HIGH (ref 70–99)
Potassium: 3.7 mmol/L (ref 3.5–5.1)
Sodium: 141 mmol/L (ref 135–145)
Total Bilirubin: 0.9 mg/dL (ref 0.3–1.2)
Total Protein: 7.2 g/dL (ref 6.5–8.1)

## 2018-08-14 LAB — CBC WITH DIFFERENTIAL/PLATELET
Abs Immature Granulocytes: 0.03 10*3/uL (ref 0.00–0.07)
Basophils Absolute: 0 10*3/uL (ref 0.0–0.1)
Basophils Relative: 1 %
Eosinophils Absolute: 0.1 10*3/uL (ref 0.0–0.5)
Eosinophils Relative: 1 %
HEMATOCRIT: 48.4 % (ref 39.0–52.0)
HEMOGLOBIN: 15.6 g/dL (ref 13.0–17.0)
IMMATURE GRANULOCYTES: 1 %
LYMPHS ABS: 3 10*3/uL (ref 0.7–4.0)
LYMPHS PCT: 53 %
MCH: 29 pg (ref 26.0–34.0)
MCHC: 32.2 g/dL (ref 30.0–36.0)
MCV: 90 fL (ref 80.0–100.0)
MONO ABS: 0.4 10*3/uL (ref 0.1–1.0)
MONOS PCT: 7 %
NRBC: 0 % (ref 0.0–0.2)
Neutro Abs: 2 10*3/uL (ref 1.7–7.7)
Neutrophils Relative %: 37 %
Platelets: 274 10*3/uL (ref 150–400)
RBC: 5.38 MIL/uL (ref 4.22–5.81)
RDW: 13 % (ref 11.5–15.5)
WBC: 5.5 10*3/uL (ref 4.0–10.5)

## 2018-08-14 LAB — TROPONIN I

## 2018-08-14 MED ORDER — IOPAMIDOL (ISOVUE-300) INJECTION 61%
100.0000 mL | Freq: Once | INTRAVENOUS | Status: AC | PRN
  Administered 2018-08-14: 100 mL via INTRAVENOUS

## 2018-08-14 MED ORDER — CYCLOBENZAPRINE HCL 10 MG PO TABS: 10.0000 mg | ORAL_TABLET | Freq: Three times a day (TID) | ORAL | 0 refills | Status: AC | PRN

## 2018-08-14 MED ORDER — KETOROLAC TROMETHAMINE 30 MG/ML IJ SOLN
30.0000 mg | Freq: Once | INTRAMUSCULAR | Status: AC
  Administered 2018-08-15: 30 mg via INTRAVENOUS
  Filled 2018-08-14: qty 1
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
## 2019-04-14 ENCOUNTER — Emergency Department (HOSPITAL_COMMUNITY): Payer: 59

## 2019-04-14 ENCOUNTER — Other Ambulatory Visit: Payer: Self-pay

## 2019-04-14 ENCOUNTER — Emergency Department (HOSPITAL_COMMUNITY)
Admission: EM | Admit: 2019-04-14 | Discharge: 2019-04-14 | Disposition: A | Discharge status: Home / Self Care | Payer: 59 | Attending: Emergency Medicine | Admitting: Emergency Medicine

## 2019-04-14 ENCOUNTER — Encounter (HOSPITAL_COMMUNITY): Payer: Self-pay

## 2019-04-14 DIAGNOSIS — Z23 Encounter for immunization: Secondary | ICD-10-CM | POA: Insufficient documentation

## 2019-04-14 DIAGNOSIS — S299XXA Unspecified injury of thorax, initial encounter: Secondary | ICD-10-CM | POA: Insufficient documentation

## 2019-04-14 DIAGNOSIS — Y999 Unspecified external cause status: Secondary | ICD-10-CM | POA: Insufficient documentation

## 2019-04-14 DIAGNOSIS — S7012XA Contusion of left thigh, initial encounter: Secondary | ICD-10-CM | POA: Insufficient documentation

## 2019-04-14 DIAGNOSIS — Y9389 Activity, other specified: Secondary | ICD-10-CM | POA: Diagnosis not present

## 2019-04-14 DIAGNOSIS — S0081XA Abrasion of other part of head, initial encounter: Secondary | ICD-10-CM | POA: Diagnosis not present

## 2019-04-14 DIAGNOSIS — Y92410 Unspecified street and highway as the place of occurrence of the external cause: Secondary | ICD-10-CM | POA: Diagnosis not present

## 2019-04-14 DIAGNOSIS — S0990XA Unspecified injury of head, initial encounter: Secondary | ICD-10-CM | POA: Diagnosis present

## 2019-04-14 DIAGNOSIS — T07XXXA Unspecified multiple injuries, initial encounter: Secondary | ICD-10-CM

## 2019-04-14 DIAGNOSIS — Z79899 Other long term (current) drug therapy: Secondary | ICD-10-CM | POA: Diagnosis not present

## 2019-04-14 LAB — BASIC METABOLIC PANEL
Anion gap: 8 (ref 5–15)
BUN: 15 mg/dL (ref 6–20)
CO2: 25 mmol/L (ref 22–32)
Calcium: 9.3 mg/dL (ref 8.9–10.3)
Chloride: 105 mmol/L (ref 98–111)
Creatinine, Ser: 1.25 mg/dL — ABNORMAL HIGH (ref 0.61–1.24)
GFR calc Af Amer: >60 mL/min (ref >60)
GFR calc non Af Amer: >60 mL/min (ref >60)
Glucose, Bld: 83 mg/dL (ref 70–99)
Potassium: 4.1 mmol/L (ref 3.5–5.1)
Sodium: 138 mmol/L (ref 135–145)

## 2019-04-14 LAB — CBC
HCT: 48.4 % (ref 39.0–52.0)
Hemoglobin: 15.7 g/dL (ref 13.0–17.0)
MCH: 29.8 pg (ref 26.0–34.0)
MCHC: 32.4 g/dL (ref 30.0–36.0)
MCV: 91.8 fL (ref 80.0–100.0)
Platelets: 261 10*3/uL (ref 150–400)
RBC: 5.27 MIL/uL (ref 4.22–5.81)
RDW: 13.1 % (ref 11.5–15.5)
WBC: 11.8 10*3/uL — ABNORMAL HIGH (ref 4.0–10.5)
nRBC: 0 % (ref 0.0–0.2)

## 2019-04-14 MED ORDER — SODIUM CHLORIDE 0.9 % IV SOLN
INTRAVENOUS | Status: DC
  Administered 2019-04-14: 20:00:00 via INTRAVENOUS

## 2019-04-14 MED ORDER — IOHEXOL 300 MG/ML  SOLN
100.0000 mL | Freq: Once | INTRAMUSCULAR | Status: AC | PRN
  Administered 2019-04-14: 100 mL via INTRAVENOUS

## 2019-04-14 MED ORDER — TETANUS-DIPHTH-ACELL PERTUSSIS 5-2.5-18.5 LF-MCG/0.5 IM SUSP
0.5000 mL | Freq: Once | INTRAMUSCULAR | Status: AC
  Administered 2019-04-14: 0.5 mL via INTRAMUSCULAR
  Filled 2019-04-14 (×2): qty 0.5
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
## 2019-04-14 NOTE — ED Notes (Signed)
Signature pad not working, discharge instructions reviewed with pt. No questions at this time. Pt ambulatory to waiting room at this time.

## 2019-05-03 ENCOUNTER — Emergency Department: Payer: 59

## 2019-05-03 ENCOUNTER — Encounter: Payer: Self-pay | Admitting: Emergency Medicine

## 2019-05-03 ENCOUNTER — Emergency Department
Admission: EM | Admit: 2019-05-03 | Discharge: 2019-05-03 | Disposition: A | Discharge status: Home / Self Care | Payer: 59 | Attending: Emergency Medicine | Admitting: Emergency Medicine

## 2019-05-03 ENCOUNTER — Other Ambulatory Visit: Payer: Self-pay

## 2019-05-03 DIAGNOSIS — R079 Chest pain, unspecified: Secondary | ICD-10-CM | POA: Diagnosis not present

## 2019-05-03 DIAGNOSIS — Z87891 Personal history of nicotine dependence: Secondary | ICD-10-CM | POA: Insufficient documentation

## 2019-05-03 LAB — BASIC METABOLIC PANEL
Anion gap: 10 (ref 5–15)
BUN: 14 mg/dL (ref 6–20)
CO2: 25 mmol/L (ref 22–32)
Calcium: 9.3 mg/dL (ref 8.9–10.3)
Chloride: 104 mmol/L (ref 98–111)
Creatinine, Ser: 1.25 mg/dL — ABNORMAL HIGH (ref 0.61–1.24)
GFR calc Af Amer: >60 mL/min (ref >60)
GFR calc non Af Amer: >60 mL/min (ref >60)
Glucose, Bld: 93 mg/dL (ref 70–99)
Potassium: 3.5 mmol/L (ref 3.5–5.1)
Sodium: 139 mmol/L (ref 135–145)

## 2019-05-03 LAB — CBC
HCT: 44.8 % (ref 39.0–52.0)
Hemoglobin: 15.2 g/dL (ref 13.0–17.0)
MCH: 29.3 pg (ref 26.0–34.0)
MCHC: 33.9 g/dL (ref 30.0–36.0)
MCV: 86.3 fL (ref 80.0–100.0)
Platelets: 277 10*3/uL (ref 150–400)
RBC: 5.19 MIL/uL (ref 4.22–5.81)
RDW: 12.8 % (ref 11.5–15.5)
WBC: 6.4 10*3/uL (ref 4.0–10.5)
nRBC: 0 % (ref 0.0–0.2)

## 2019-05-03 LAB — TROPONIN I (HIGH SENSITIVITY): Troponin I (High Sensitivity): 3 ng/L (ref <18)

## 2019-05-03 MED ORDER — FAMOTIDINE 20 MG PO TABS: 20.0000 mg | ORAL_TABLET | Freq: Every day | ORAL | 1 refills | Status: AC

## 2019-05-03 MED ORDER — SUCRALFATE 1 G PO TABS: 1.0000 g | ORAL_TABLET | Freq: Four times a day (QID) | ORAL | 0 refills | Status: AC

## 2019-05-03 NOTE — ED Triage Notes (Addendum)
Patient with complaint of left side chest pain times two weeks. Patient states that the pain gets worse when laying down. Patient states that it feels like his heart is fluttering. Patient states that he has had intermittent shortness of breath. Patient denies nausea or vomiting.

## 2019-05-03 NOTE — ED Notes (Signed)
Patient transported to X-ray 
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
## 2019-05-03 NOTE — Discharge Instructions (Addendum)
Please seek medical attention for any high fevers, chest pain, shortness of breath, change in behavior, persistent vomiting, bloody stool or any other new or concerning symptoms.  
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
## 2020-04-16 ENCOUNTER — Ambulatory Visit: Payer: Self-pay | Admitting: Family Medicine

## 2021-03-05 ENCOUNTER — Encounter: Payer: Self-pay | Admitting: Emergency Medicine

## 2021-03-05 ENCOUNTER — Other Ambulatory Visit: Payer: Self-pay

## 2021-03-05 ENCOUNTER — Emergency Department
Admission: EM | Admit: 2021-03-05 | Discharge: 2021-03-05 | Disposition: A | Discharge status: Home / Self Care | Payer: BC Managed Care – PPO | Attending: Emergency Medicine | Admitting: Emergency Medicine

## 2021-03-05 DIAGNOSIS — X58XXXA Exposure to other specified factors, initial encounter: Secondary | ICD-10-CM | POA: Diagnosis not present

## 2021-03-05 DIAGNOSIS — S61216A Laceration without foreign body of right little finger without damage to nail, initial encounter: Secondary | ICD-10-CM | POA: Insufficient documentation

## 2021-03-05 DIAGNOSIS — Z87891 Personal history of nicotine dependence: Secondary | ICD-10-CM | POA: Insufficient documentation

## 2021-03-05 DIAGNOSIS — S6991XA Unspecified injury of right wrist, hand and finger(s), initial encounter: Secondary | ICD-10-CM | POA: Diagnosis present

## 2021-03-05 NOTE — ED Provider Notes (Signed)
[facility]-EMERGENCY DEPARTMENT  ____________________________________________  Time seen: Approximately 10:55 PM  I have reviewed the triage vital signs and the nursing notes.   HISTORY  Chief Complaint Laceration   Historian Patient     HPI Eddie York is a 30 y.o. male presents to the emergency department with a superficial laceration along the ulnar aspect of the right fifth digit sustained when patient was working on his car.  Tetanus status is up-to-date.  No numbness or tingling in the right hand.   History reviewed. No pertinent past medical history.   Immunizations up to date:  Yes.     History reviewed. No pertinent past medical history.  There are no problems to display for this patient.   History reviewed. No pertinent surgical history.  Prior to Admission medications   Medication Sig Start Date End Date Taking? Authorizing Provider  cyclobenzaprine (FLEXERIL) 10 MG tablet Take 1 tablet (10 mg total) by mouth 3 (three) times daily as needed. 08/14/18   Darci Current, MD  famotidine (PEPCID) 20 MG tablet Take 1 tablet (20 mg total) by mouth daily. 05/03/19 05/02/20  Phineas Semen, MD  sucralfate (CARAFATE) 1 g tablet Take 1 tablet (1 g total) by mouth 4 (four) times daily. 05/03/19   Phineas Semen, MD    Allergies Patient has no known allergies.  No family history on file.  Social History Social History   Tobacco Use   Smoking status: Former   Smokeless tobacco: Never  Substance Use Topics   Alcohol use: Yes    Comment: occassional   Drug use: No     Review of Systems  Constitutional: No fever/chills Eyes:  No discharge ENT: No upper respiratory complaints. Respiratory: no cough. No SOB/ use of accessory muscles to breath Gastrointestinal:   No nausea, no vomiting.  No diarrhea.  No constipation. Musculoskeletal: Negative for musculoskeletal pain. Skin: Patient has superficial laceration of right fifth digit.      ____________________________________________   PHYSICAL EXAM:  VITAL SIGNS: ED Triage Vitals  Enc Vitals Group     BP 03/05/21 1818 (!) 160/87     Pulse Rate 03/05/21 1818 88     Resp 03/05/21 1818 16     Temp 03/05/21 1818 98.3 F (36.8 C)     Temp Source 03/05/21 1818 Oral     SpO2 03/05/21 1818 98 %     Weight 03/05/21 1747 300 lb (136.1 kg)     Height 03/05/21 1747 6' (1.829 m)     Head Circumference --      Peak Flow --      Pain Score 03/05/21 1747 7     Pain Loc --      Pain Edu? --      Excl. in GC? --      Constitutional: Alert and oriented. Well appearing and in no acute distress. Eyes: Conjunctivae are normal. PERRL. EOMI. Head: Atraumatic. ENT:  Cardiovascular: Normal rate, regular rhythm. Normal S1 and S2.  Good peripheral circulation. Respiratory: Normal respiratory effort without tachypnea or retractions. Lungs CTAB. Good air entry to the bases with no decreased or absent breath sounds Gastrointestinal: Bowel sounds x 4 quadrants. Soft and nontender to palpation. No guarding or rigidity. No distention. Musculoskeletal: Full range of motion to all extremities. No obvious deformities noted Neurologic:  Normal for age. No gross focal neurologic deficits are appreciated.  Skin: Patient has superficial laceration of right fifth digit. Psychiatric: Mood and affect are normal for age. Speech and behavior  are normal.   ____________________________________________   LABS (all labs ordered are listed, but only abnormal results are displayed)  Labs Reviewed - No data to display ____________________________________________  EKG   ____________________________________________  RADIOLOGY   No results found.  ____________________________________________    PROCEDURES  Procedure(s) performed:     Marland KitchenMarland KitchenLaceration Repair  Date/Time: 03/05/2021 10:58 PM Performed by: Orvil Feil, PA-C Authorized by: Orvil Feil, PA-C   Consent:    Consent  obtained:  Verbal   Risks discussed:  Infection and pain Universal protocol:    Procedure explained and questions answered to patient or proxy's satisfaction: yes     Patient identity confirmed:  Verbally with patient Laceration details:    Location:  Finger   Finger location:  R small finger   Length (cm):  1 Pre-procedure details:    Preparation:  Patient was prepped and draped in usual sterile fashion Exploration:    Limited defect created (wound extended): no     Contaminated: no   Treatment:    Amount of cleaning:  Standard   Irrigation method:  Tap   Visualized foreign bodies/material removed: no     Debridement:  None   Undermining:  None Skin repair:    Repair method:  Tissue adhesive Approximation:    Approximation:  Close Repair type:    Repair type:  Simple Post-procedure details:    Dressing:  Non-adherent dressing     Medications - No data to display   ____________________________________________   INITIAL IMPRESSION / ASSESSMENT AND PLAN / ED COURSE  Pertinent labs & imaging results that were available during my care of the patient were reviewed by me and considered in my medical decision making (see chart for details).      Assessment and plan 30 year old male presents to the emergency department with a superficial laceration of the right fifth digit repaired in the emergency department using Dermabond.  Tetanus status is up-to-date.  He is advised to follow-up with primary care as needed.     ____________________________________________  FINAL CLINICAL IMPRESSION(S) / ED DIAGNOSES  Final diagnoses:  Laceration of right little finger without foreign body without damage to nail, initial encounter      NEW MEDICATIONS STARTED DURING THIS VISIT:  ED Discharge Orders     None           This chart was dictated using voice recognition software/Dragon. Despite best efforts to proofread, errors can occur which can change the meaning. Any  change was purely unintentional.     Orvil Feil, PA-C 03/05/21 2300    Gilles Chiquito, MD 03/06/21 [id number]

## 2021-03-05 NOTE — ED Triage Notes (Signed)
Pt reports cut his right hand pinky finger working on his car. Bleeding controlled
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
